# Patient Record
Sex: Female | Born: 1985 | Race: Black or African American | Hispanic: No | Marital: Married
Health system: Southern US, Community
[De-identification: ages and names within clinical notes are randomized; demographics above are authoritative.]

---

## 2018-02-13 ENCOUNTER — Emergency Department (HOSPITAL_COMMUNITY): Payer: Self-pay

## 2018-02-13 ENCOUNTER — Emergency Department (HOSPITAL_COMMUNITY)
Admission: EM | Admit: 2018-02-13 | Discharge: 2018-02-13 | Disposition: A | Discharge status: Home / Self Care | Payer: Self-pay | Attending: Emergency Medicine | Admitting: Emergency Medicine

## 2018-02-13 ENCOUNTER — Other Ambulatory Visit: Payer: Self-pay

## 2018-02-13 ENCOUNTER — Encounter (HOSPITAL_COMMUNITY): Payer: Self-pay | Admitting: Emergency Medicine

## 2018-02-13 DIAGNOSIS — R1013 Epigastric pain: Secondary | ICD-10-CM | POA: Insufficient documentation

## 2018-02-13 LAB — BASIC METABOLIC PANEL WITH GFR
Anion gap: 11 (ref 5–15)
BUN: 9 mg/dL (ref 6–20)
CO2: 26 mmol/L (ref 22–32)
Calcium: 9.4 mg/dL (ref 8.9–10.3)
Chloride: 99 mmol/L (ref 98–111)
Creatinine, Ser: 0.77 mg/dL (ref 0.44–1.00)
GFR calc Af Amer: >60 mL/min (ref >60)
GFR calc non Af Amer: >60 mL/min (ref >60)
Glucose, Bld: 126 mg/dL — ABNORMAL HIGH (ref 70–99)
Potassium: 2.8 mmol/L — ABNORMAL LOW (ref 3.5–5.1)
Sodium: 136 mmol/L (ref 135–145)

## 2018-02-13 LAB — HEPATIC FUNCTION PANEL
ALT: 70 U/L — ABNORMAL HIGH (ref 0–44)
AST: 126 U/L — ABNORMAL HIGH (ref 15–41)
Albumin: 4.4 g/dL (ref 3.5–5.0)
Alkaline Phosphatase: 60 U/L (ref 38–126)
Bilirubin, Direct: 0.1 mg/dL (ref 0.0–0.2)
Indirect Bilirubin: 0.7 mg/dL (ref 0.3–0.9)
Total Bilirubin: 0.8 mg/dL (ref 0.3–1.2)
Total Protein: 7.8 g/dL (ref 6.5–8.1)

## 2018-02-13 LAB — LIPASE, BLOOD: Lipase: 22 U/L (ref 11–51)

## 2018-02-13 LAB — CBC
HCT: 43.4 % (ref 36.0–46.0)
Hemoglobin: 13.3 g/dL (ref 12.0–15.0)
MCH: 25.4 pg — ABNORMAL LOW (ref 26.0–34.0)
MCHC: 30.6 g/dL (ref 30.0–36.0)
MCV: 82.8 fL (ref 80.0–100.0)
Platelets: 288 K/uL (ref 150–400)
RBC: 5.24 MIL/uL — ABNORMAL HIGH (ref 3.87–5.11)
RDW: 13.5 % (ref 11.5–15.5)
WBC: 12.6 K/uL — ABNORMAL HIGH (ref 4.0–10.5)
nRBC: 0 % (ref 0.0–0.2)

## 2018-02-13 LAB — I-STAT TROPONIN, ED: Troponin i, poc: 0 ng/mL (ref 0.00–0.08)

## 2018-02-13 LAB — I-STAT BETA HCG BLOOD, ED (MC, WL, AP ONLY)

## 2018-02-13 LAB — D-DIMER, QUANTITATIVE: D-Dimer, Quant: 0.37 ug{FEU}/mL (ref 0.00–0.50)

## 2018-02-13 MED ORDER — ONDANSETRON HCL 4 MG/2ML IJ SOLN
4.0000 mg | Freq: Once | INTRAMUSCULAR | Status: AC
  Administered 2018-02-13: 4 mg via INTRAVENOUS
  Filled 2018-02-13: qty 2

## 2018-02-13 MED ORDER — POTASSIUM CHLORIDE CRYS ER 20 MEQ PO TBCR
40.0000 meq | EXTENDED_RELEASE_TABLET | Freq: Once | ORAL | Status: AC
  Administered 2018-02-13: 40 meq via ORAL
  Filled 2018-02-13: qty 2

## 2018-02-13 MED ORDER — OMEPRAZOLE 20 MG PO CPDR: 20.0000 mg | DELAYED_RELEASE_CAPSULE | Freq: Every day | ORAL | 0 refills | Status: AC

## 2018-02-13 MED ORDER — MORPHINE SULFATE (PF) 4 MG/ML IV SOLN
4.0000 mg | Freq: Once | INTRAVENOUS | Status: AC
  Administered 2018-02-13: 4 mg via INTRAVENOUS
  Filled 2018-02-13: qty 1

## 2018-02-13 NOTE — ED Notes (Signed)
Patient reports radiation to the right shoulder blade from her chest. Pt also reports she has been getting night sweats.

## 2018-02-13 NOTE — ED Triage Notes (Addendum)
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  Denies shortness of breath, nausea.

## 2018-02-13 NOTE — ED Notes (Signed)
Patient transported to X-ray 

## 2018-02-13 NOTE — ED Notes (Signed)
Pt educated about pain medication and being driven home by husband.

## 2018-02-13 NOTE — ED Notes (Signed)
ED Provider at bedside. 

## 2018-02-13 NOTE — ED Provider Notes (Signed)
[facility] Provider Note   CSN: [id number] Arrival date & time: 02/13/18  1413     History   Chief Complaint Chief Complaint  Patient presents with  . Chest Pain    HPI Dominique Whitaker is a 32 y.o. female.  Patient presents to the emergency department with a chief complaint of chest pain and epigastric pain that radiates to the right shoulder blade and back.  She reports having some nonproductive cough as well as some nausea, but no vomiting.  She denies any fevers or chills.  She states that she has had some intermittent right upper abdominal pain for the past couple of weeks.  She does still have her gallbladder.  She has tried taking Tums with no relief.  She rates her pain as a 4/10.  The history is provided by the patient. No language interpreter was used.    History reviewed. No pertinent past medical history.  There are no active problems to display for this patient.     OB History   None      Home Medications    Prior to Admission medications   Not on File    Family History History reviewed. No pertinent family history.  Social History Social History   Tobacco Use  . Smoking status: Not on file  Substance Use Topics  . Alcohol use: Not on file  . Drug use: Not on file     Allergies   Darvon [propoxyphene]   Review of Systems Review of Systems  All other systems reviewed and are negative.    Physical Exam Updated Vital Signs BP (!) 152/92   Pulse 94   Temp 97.9 F (36.6 C) (Oral)   Resp 16   Ht 5' 3.5" (1.613 m)   Wt 70.3 kg   LMP 02/13/2018 (Exact Date)   SpO2 100%   BMI 27.03 kg/m   Physical Exam  Constitutional: She is oriented to person, place, and time. She appears well-developed and well-nourished.  HENT:  Head: Normocephalic and atraumatic.  Eyes: Pupils are equal, round, and reactive to light. Conjunctivae and EOM are normal.  Neck: Normal range of motion. Neck supple.    Cardiovascular: Normal rate and regular rhythm. Exam reveals no gallop and no friction rub.  No murmur heard. Pulmonary/Chest: Effort normal and breath sounds normal. No respiratory distress. She has no wheezes. She has no rales. She exhibits no tenderness.  Abdominal: Soft. Bowel sounds are normal. She exhibits no distension and no mass. There is tenderness. There is no rebound and no guarding.  Epigastric abdominal tenderness, mild RUQ tenderness  Musculoskeletal: Normal range of motion. She exhibits no edema or tenderness.  Neurological: She is alert and oriented to person, place, and time.  Skin: Skin is warm and dry.  Psychiatric: She has a normal mood and affect. Her behavior is normal. Judgment and thought content normal.  Nursing note and vitals reviewed.    ED Treatments / Results  Labs (all labs ordered are listed, but only abnormal results are displayed) Labs Reviewed  BASIC METABOLIC PANEL - Abnormal; Notable for the following components:      Result Value   Potassium 2.8 (*)    Glucose, Bld 126 (*)    All other components within normal limits  CBC - Abnormal; Notable for the following components:   WBC 12.6 (*)    RBC 5.24 (*)    MCH 25.4 (*)    All other components within normal limits  HEPATIC FUNCTION PANEL - Abnormal; Notable for the following components:   AST 126 (*)    ALT 70 (*)    All other components within normal limits  LIPASE, BLOOD  D-DIMER, QUANTITATIVE (NOT AT [facility])  I-STAT TROPONIN, ED  I-STAT BETA HCG BLOOD, ED ([facility], [facility], [facility] ONLY)    EKG EKG Interpretation  Date/Time:  Thursday February 13 2018 14:26:58 EST Ventricular Rate:  98 PR Interval:    QRS Duration: 81 QT Interval:  346 QTC Calculation: 442 R Axis:   74 Text Interpretation:  Sinus rhythm Confirmed by Kristine RoyalMessick, Peter [phone]([id number]) on 02/13/2018 4:00:18 PM   Radiology Dg Chest 2 View  Result Date: 02/13/2018 CLINICAL DATA:  Chest pain. EXAM: CHEST - 2 VIEW COMPARISON:  None. FINDINGS:  The heart size and mediastinal contours are within normal limits. Both lungs are clear. No pneumothorax or pleural effusion is noted. The visualized skeletal structures are unremarkable. IMPRESSION: No active cardiopulmonary disease. Electronically Signed   By: Lupita RaiderJames  Green Jr, M.D.   On: 02/13/2018 15:01    Procedures Procedures (including critical care time)  Medications Ordered in ED Medications  morphine 4 MG/ML injection 4 mg (has no administration in time range)  ondansetron (ZOFRAN) injection 4 mg (has no administration in time range)     Initial Impression / Assessment and Plan / ED Course  I have reviewed the triage vital signs and the nursing notes.  Pertinent labs & imaging results that were available during my care of the patient were reviewed by me and considered in my medical decision making (see chart for details).     Patient with epigastric pain and chest pain that radiates to her back.  Symptoms started today.  She reports having some mild nausea and dry cough.  Vital signs are stable.  Patient is afebrile.  Chest x-ray negative.  EKG is normal.  LFTs are slightly elevated, will check right upper quadrant ultrasound.  Ultrasound negative for cholelithiasis, no evidence of cholecystitis.  Lipase is normal.  On reassessment, patient was tachycardic to the 120s, d-dimer was added, but was negative.  I suspect that patient symptoms are either GERD or anxiety related, and do not feel that any additional emergency department work-up is indicated.  Patient agrees, and believes that her symptoms are likely GERD.  Will prescribe omeprazole.  Recommend PCP follow-up.  Return precautions given.  Patient is stable and ready for discharge.  Final Clinical Impressions(s) / ED Diagnoses   Final diagnoses:  Epigastric pain    ED Discharge Orders         Ordered    omeprazole (PRILOSEC) 20 MG capsule  Daily     02/13/18 1727           Roxy HorsemanBrowning, Kenijah Benningfield, PA-C 02/13/18  1730    Wynetta FinesMessick, Peter C, MD 02/14/18 [id number]

## 2019-04-23 IMAGING — US US ABDOMEN LIMITED
1 series · 14 of 25 positions shown · non-contrast
Comparison: None.

CLINICAL DATA: Acute epigastric and RIGHT UPPER QUADRANT abdominal
pain.

EXAM:
ULTRASOUND ABDOMEN LIMITED RIGHT UPPER QUADRANT

[Series 1: us abdomen limited · 0.19mm/px · 14 of 25 slices shown]
[im 1/25]
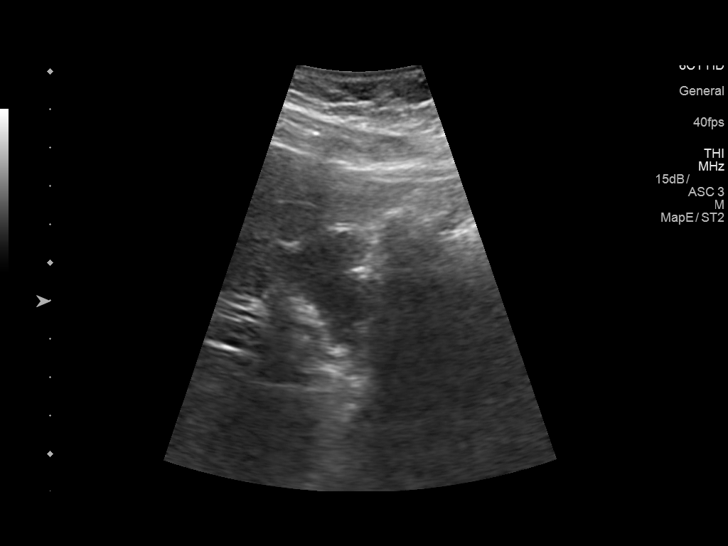
[im 3/25]
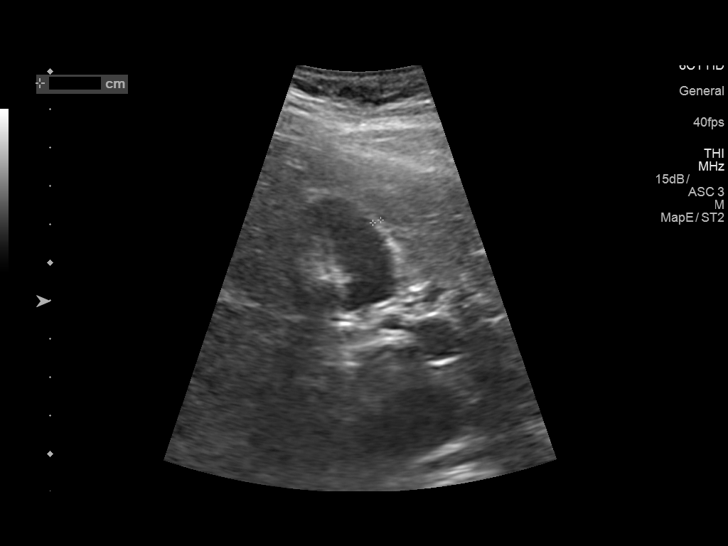
[im 5/25]
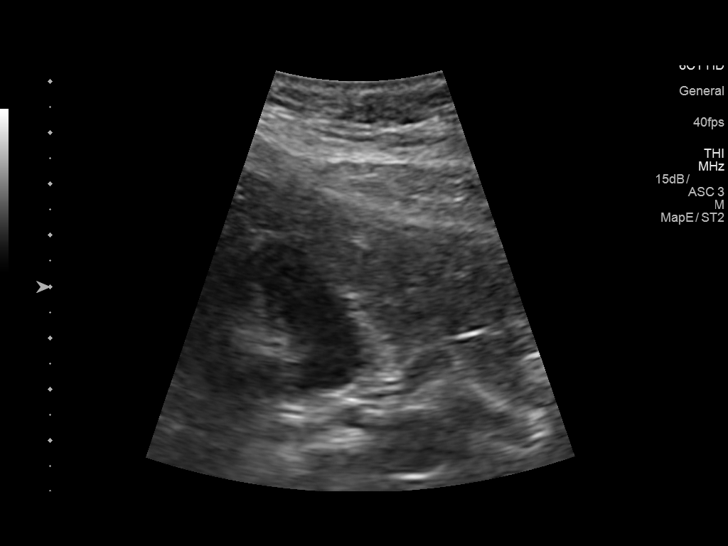
[im 7/25]
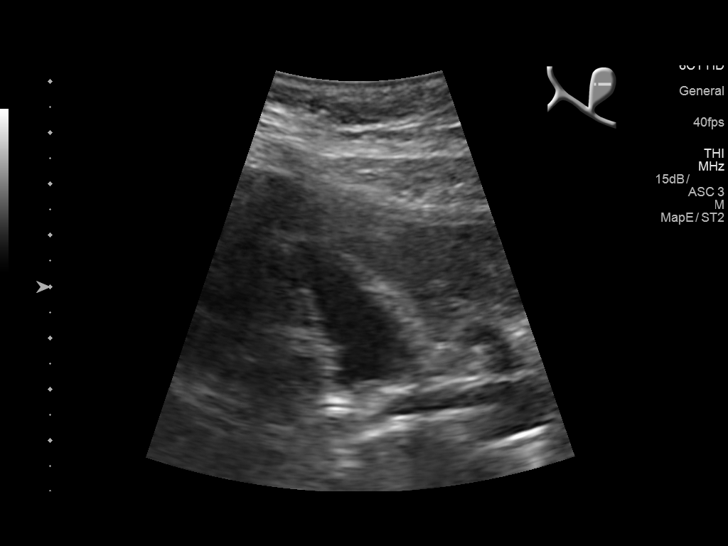
[im 9/25]
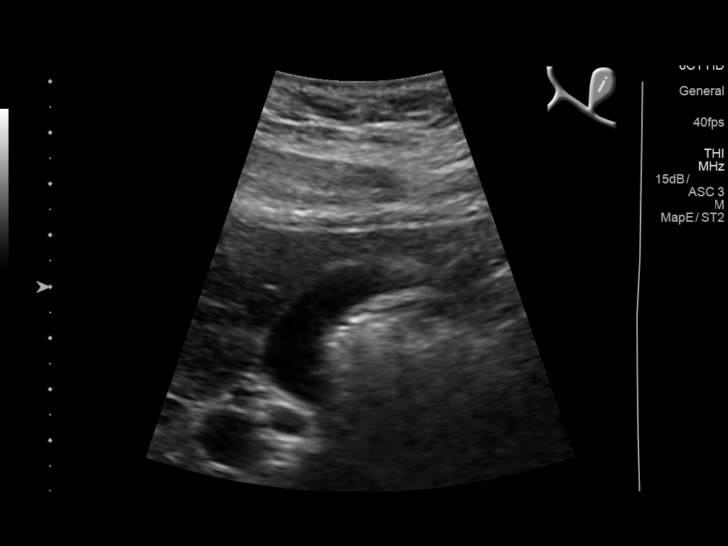
[im 10/25]
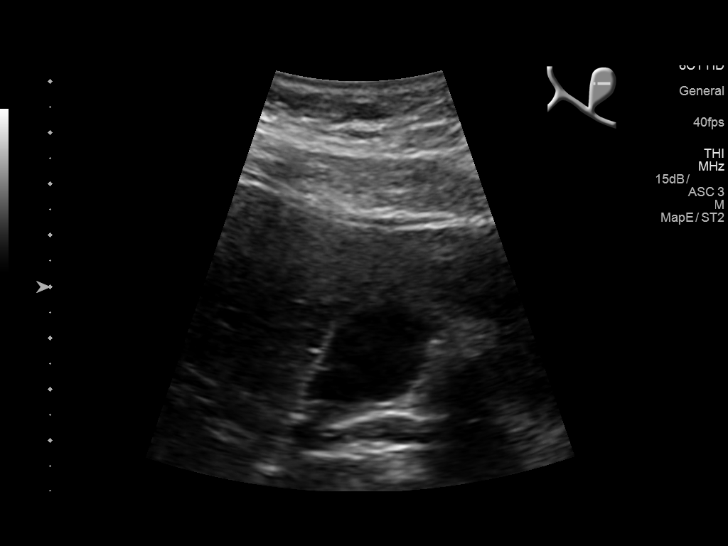
[im 12/25]
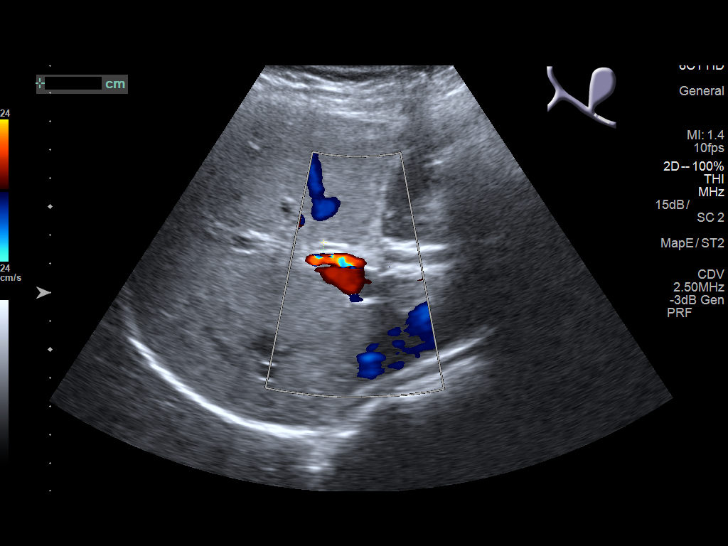
[im 14/25]
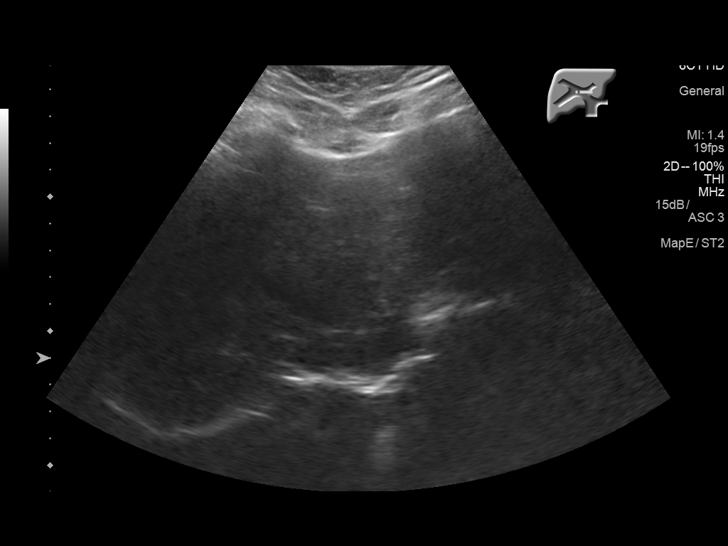
[im 16/25]
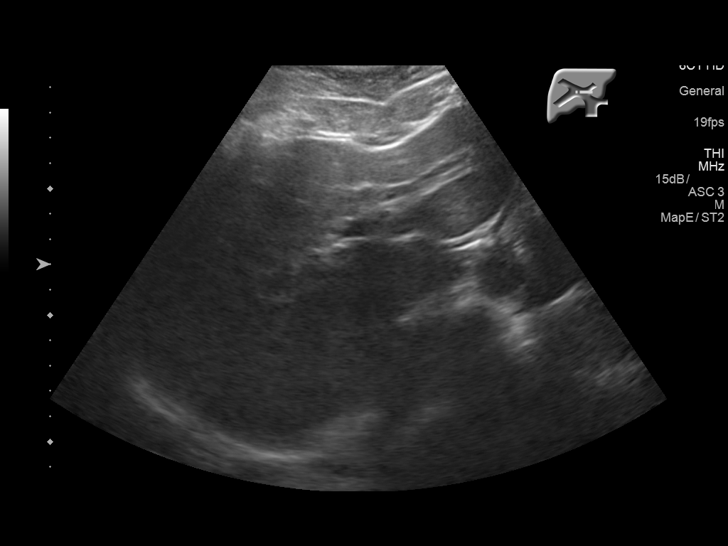
[im 17/25]
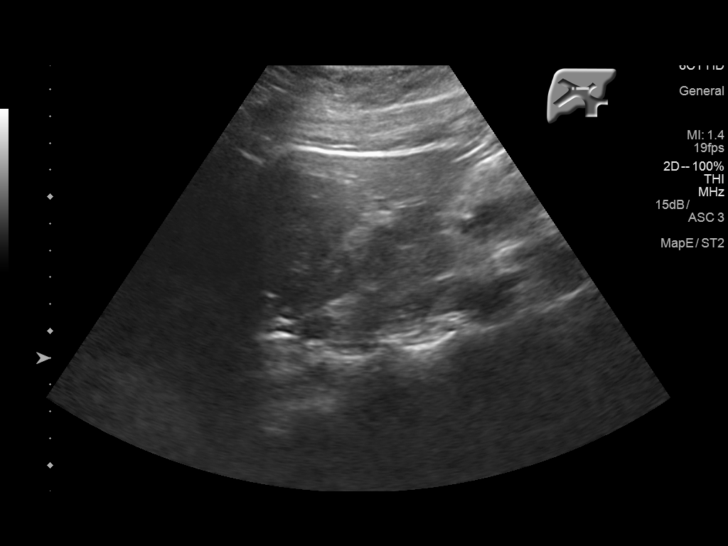
[im 19/25]
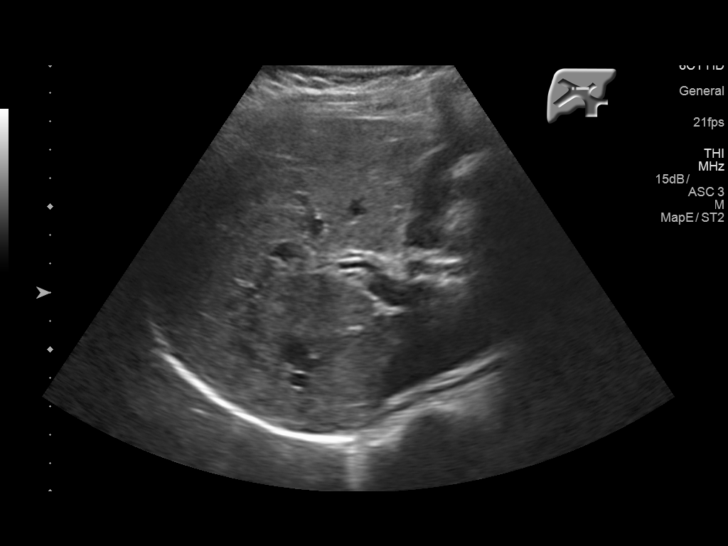
[im 21/25]
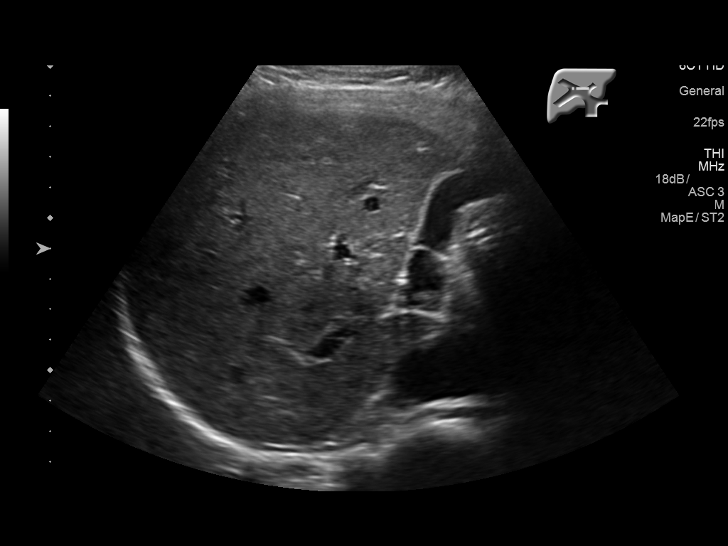
[im 23/25]
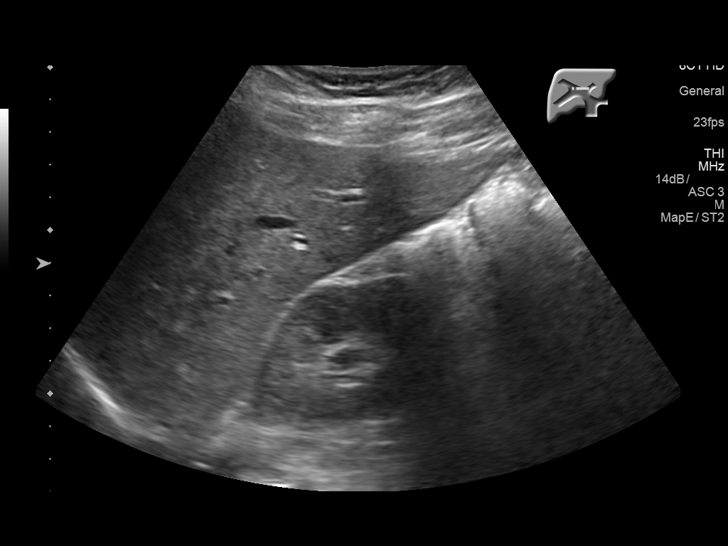
[im 25/25]
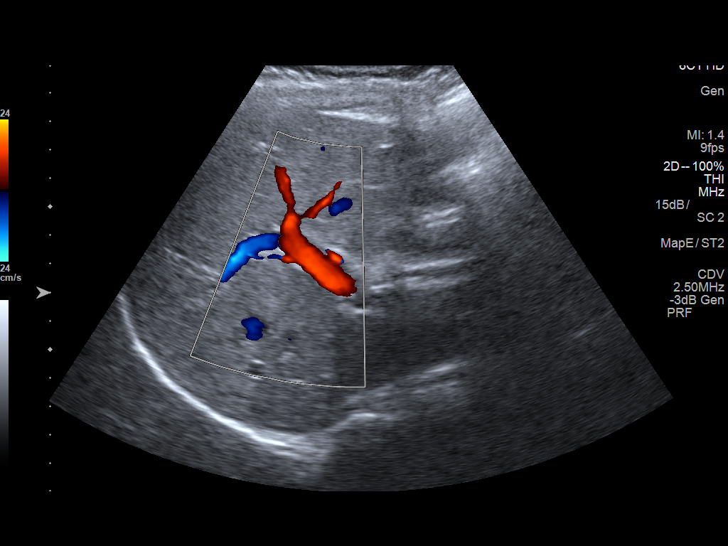

[14 of 25 positions shown; findings below may reference images not displayed]

FINDINGS: Gallbladder:

Mildly contracted. No shadowing gallstones or echogenic sludge. No
gallbladder wall thickening or pericholecystic fluid. Negative
sonographic Murphy sign according to the ultrasound technologist.

Common bile duct:

Diameter: Approximately 3 mm.

Liver:

Normal size and echotexture without focal parenchymal abnormality.
Portal vein is patent on color Doppler imaging with normal direction
of blood flow towards the liver.
IMPRESSION: Normal examination.

## 2020-04-09 IMAGING — CR DG CHEST 2V
2 series · 2 of 2 positions shown · non-contrast
Comparison: None.

CLINICAL DATA: Chest pain.

EXAM:
CHEST - 2 VIEW

[w chest pa]
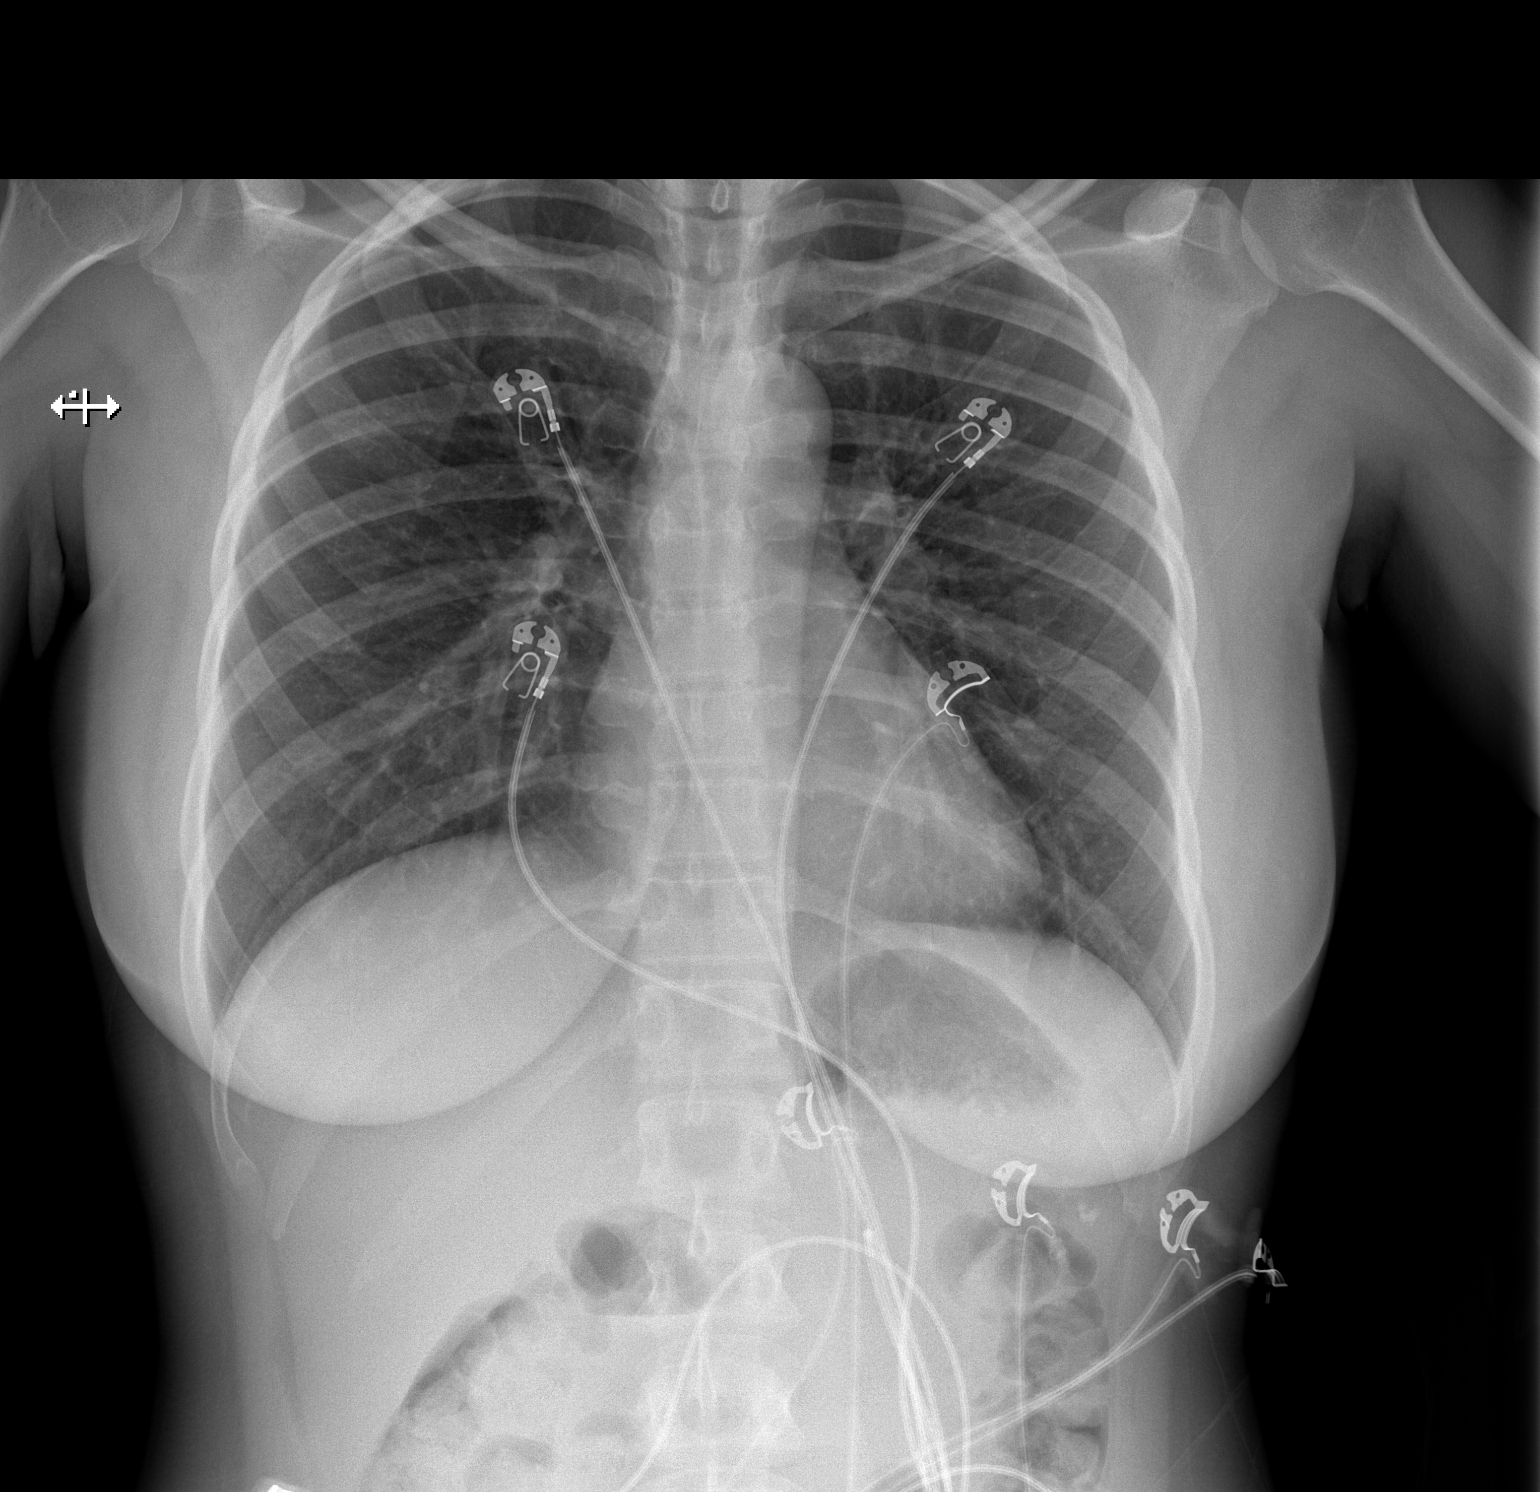

[w chest lat]
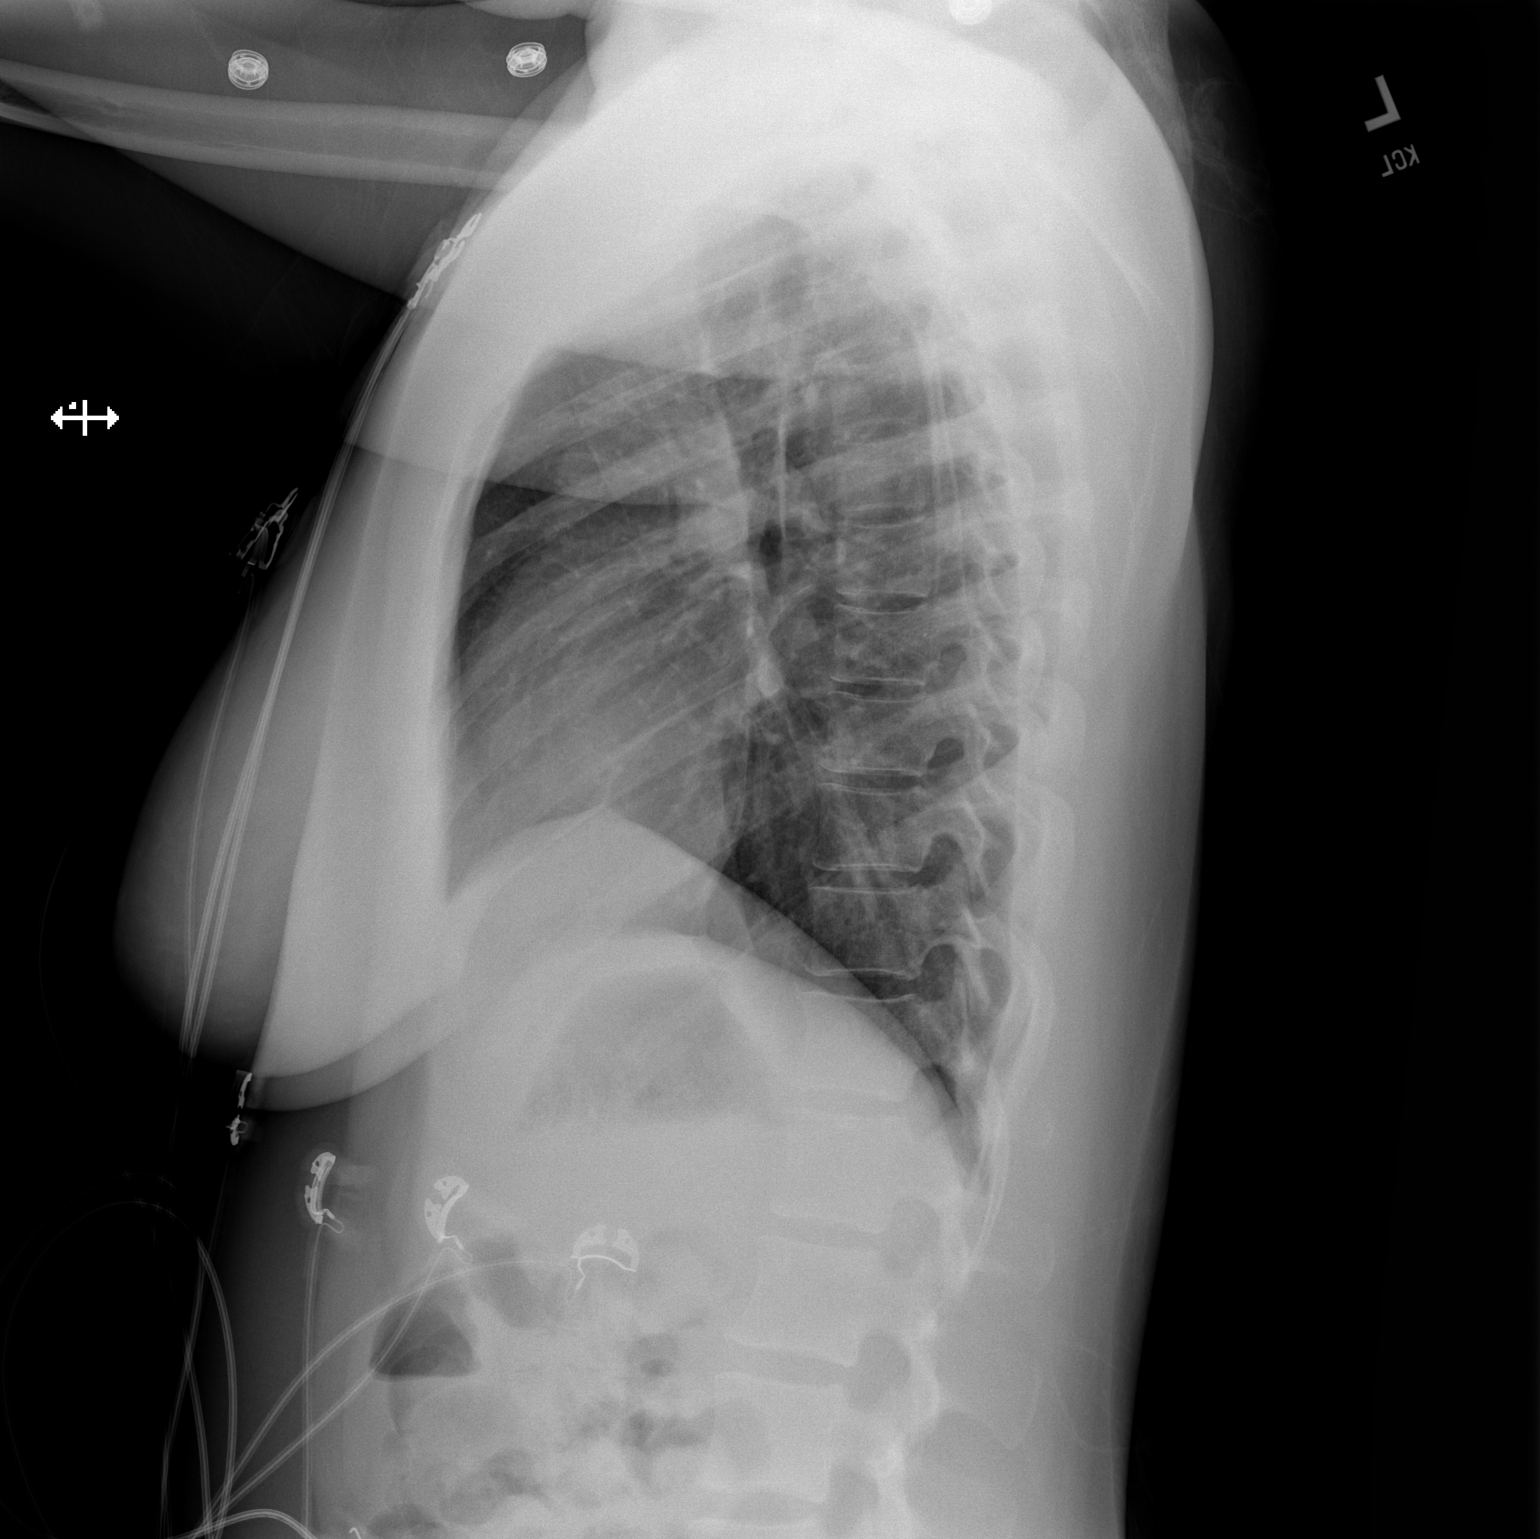

[2 of 2 positions shown; findings below may reference images not displayed]

FINDINGS: The heart size and mediastinal contours are within normal limits.
Both lungs are clear. No pneumothorax or pleural effusion is noted.
The visualized skeletal structures are unremarkable.
IMPRESSION: No active cardiopulmonary disease.
